# Patient Record
Sex: Male | Born: 1980 | Race: White | Hispanic: No | Marital: Married | Smoking: Current every day smoker
Health system: Southern US, Community
[De-identification: ages and names within clinical notes are randomized; demographics above are authoritative.]

---

## 2005-08-30 ENCOUNTER — Inpatient Hospital Stay (HOSPITAL_COMMUNITY): Admission: EM | Admit: 2005-08-30 | Discharge: 2005-09-02 | Payer: Self-pay | Admitting: Psychiatry

## 2005-08-30 ENCOUNTER — Ambulatory Visit: Payer: Self-pay | Admitting: Psychiatry

## 2015-02-18 ENCOUNTER — Encounter (HOSPITAL_COMMUNITY): Payer: Self-pay | Admitting: Family Medicine

## 2015-02-18 ENCOUNTER — Emergency Department (HOSPITAL_COMMUNITY)
Admission: EM | Admit: 2015-02-18 | Discharge: 2015-02-18 | Disposition: A | Discharge status: Home / Self Care | Payer: 59 | Attending: Emergency Medicine | Admitting: Emergency Medicine

## 2015-02-18 DIAGNOSIS — Z23 Encounter for immunization: Secondary | ICD-10-CM | POA: Diagnosis not present

## 2015-02-18 DIAGNOSIS — Z720 Tobacco use: Secondary | ICD-10-CM | POA: Insufficient documentation

## 2015-02-18 DIAGNOSIS — Y9389 Activity, other specified: Secondary | ICD-10-CM | POA: Insufficient documentation

## 2015-02-18 DIAGNOSIS — Y998 Other external cause status: Secondary | ICD-10-CM | POA: Diagnosis not present

## 2015-02-18 DIAGNOSIS — Y9289 Other specified places as the place of occurrence of the external cause: Secondary | ICD-10-CM | POA: Insufficient documentation

## 2015-02-18 DIAGNOSIS — Y288XXA Contact with other sharp object, undetermined intent, initial encounter: Secondary | ICD-10-CM | POA: Diagnosis not present

## 2015-02-18 DIAGNOSIS — IMO0002 Reserved for concepts with insufficient information to code with codable children: Secondary | ICD-10-CM

## 2015-02-18 DIAGNOSIS — S61512A Laceration without foreign body of left wrist, initial encounter: Secondary | ICD-10-CM | POA: Diagnosis present

## 2015-02-18 MED ORDER — LIDOCAINE HCL 1 % IJ SOLN
5.0000 mL | Freq: Once | INTRAMUSCULAR | Status: AC
  Administered 2015-02-18: 5 mL
  Filled 2015-02-18: qty 5

## 2015-02-18 MED ORDER — IBUPROFEN 400 MG PO TABS
800.0000 mg | ORAL_TABLET | Freq: Once | ORAL | Status: AC
  Administered 2015-02-18: 800 mg via ORAL
  Filled 2015-02-18: qty 2

## 2015-02-18 MED ORDER — TETANUS-DIPHTH-ACELL PERTUSSIS 5-2.5-18.5 LF-MCG/0.5 IM SUSP
0.5000 mL | Freq: Once | INTRAMUSCULAR | Status: AC
  Administered 2015-02-18: 0.5 mL via INTRAMUSCULAR
  Filled 2015-02-18: qty 0.5

## 2015-02-18 NOTE — ED Provider Notes (Signed)
CSN: [id number]     Arrival date & time 02/18/15  1505 History  This chart was scribed for non-physician practitioner, Eyvonne Mechanic, PA-C working with Melene Plan, DO by Gwenyth Ober, ED scribe. This patient was seen in room TR06C/TR06C and the patient's care was started at 3:18 PM   Chief Complaint  Patient presents with  . Laceration   The history is provided by the patient. No language interpreter was used.   HPI Comments:  Derek Estrada is a 34 y.o. male who presents to the Emergency Department complaining of a laceration, with controlled bleeding, to his medial left wrist, with associated 6/10 pain, that occurred PTA. Marland Kitchen He has not tried any treatment PTA, but notes he had his wound wrapped tightly until he arrived in the ED. 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 Pt denies foreign bodies in the wound. He also denies decreased ROM.   History reviewed. No pertinent past medical history. History reviewed. No pertinent past surgical history. History reviewed. No pertinent family history. Social History  Substance Use Topics  . Smoking status: Current Every Day Smoker  . Smokeless tobacco: None  . Alcohol Use: None    Review of Systems  All other systems reviewed and are negative.  Allergies  Review of patient's allergies indicates no known allergies.  Home Medications   Prior to Admission medications   Not on File   BP 126/75 mmHg  Pulse 80  Temp(Src) 98.3 F (36.8 C) (Oral)  Resp 14  Ht  (1.753 m)  Wt 125 lb (56.7 kg)  BMI 18.45 kg/m2  SpO2 100%   Physical Exam  Constitutional: He is oriented to person, place, and time. He appears well-developed and well-nourished. No distress.  HENT:  Head: Normocephalic and atraumatic.  Eyes: Conjunctivae and EOM are normal.  Neck: Neck supple. No tracheal deviation present.  Cardiovascular: Normal rate, regular rhythm, normal heart sounds  and intact distal pulses.   Pulmonary/Chest: Effort normal and breath sounds normal. No respiratory distress.  Clear to ausculation bilaterally  Musculoskeletal: Normal range of motion.  Good ROM of left elbow and arm Decreased sensation in 4th digit Normal ROM of all fingers 1 cm laceration to medial left wrist  Neurological: He is alert and oriented to person, place, and time.  Skin: Skin is warm and dry.  Psychiatric: He has a normal mood and affect. His behavior is normal.  Nursing note and vitals reviewed.   ED Course  Procedures   DIAGNOSTIC STUDIES: Oxygen Saturation is 100% on RA, normal by my interpretation.    COORDINATION OF CARE: 3:27 PM Discussed treatment plan with pt at bedside and pt agreed to plan.  3:28 PM  LACERATION REPAIR Performed by: Harlin Heys, PA student supervised by Burna Forts, PA-C Consent: Verbal consent obtained. Risks and benefits: risks, benefits and alternatives were discussed Patient identity confirmed: provided demographic data Time out performed prior to procedure Prepped and Draped in normal sterile fashion Wound explored Laceration Location: Medial left wrist Laceration Length: 1.0 cm No Foreign Bodies seen or palpated Anesthesia: local infiltration Local anesthetic: lidocaine 1% without epinephrine Anesthetic total: 2 ml Irrigation method: syringe Amount of cleaning: standard Skin closure: simple Number of sutures or staples: 2 Technique:simple interrupted  Patient tolerance: Patient tolerated the procedure well with no immediate complications.  Labs Review Labs Reviewed - No data to display  Imaging Review No results found. I  have personally reviewed and evaluated these images and lab results as part of my medical decision-making.   EKG Interpretation None      MDM   Final diagnoses:  Laceration   Labs:    Imaging:   Consults:   Therapeutics: Suture repair,Tetanus, Ibuprofen   Discharge Meds:    Assessment/Plan: simple laceration with no tendon, nerve, major vessel involvement, or foreign bodies. Wound care instructions given. Return precautions given  I personally performed the services described in this documentation, which was scribed in my presence. The recorded information has been reviewed and is accurate.   Eyvonne Mechanic, PA-C 02/19/15 1822  Melene Plan, DO 02/20/15 0010

## 2015-02-18 NOTE — ED Notes (Signed)
Pt sts he was working and a piece of metal cut left wrist. 1/2 inch lac to left wrist. Bleeding controlled. feeling and movement in tact.

## 2015-02-18 NOTE — Discharge Instructions (Signed)
Please follow-up with primary care provider for medical professional for evaluation in 7 days for suture removal. If signs of infection present please return immediately.

## 2015-02-23 ENCOUNTER — Emergency Department (HOSPITAL_COMMUNITY)
Admission: EM | Admit: 2015-02-23 | Discharge: 2015-02-23 | Disposition: A | Discharge status: Home / Self Care | Payer: 59 | Attending: Emergency Medicine | Admitting: Emergency Medicine

## 2015-02-23 ENCOUNTER — Encounter (HOSPITAL_COMMUNITY): Payer: Self-pay | Admitting: Emergency Medicine

## 2015-02-23 DIAGNOSIS — Y9389 Activity, other specified: Secondary | ICD-10-CM | POA: Insufficient documentation

## 2015-02-23 DIAGNOSIS — Y9289 Other specified places as the place of occurrence of the external cause: Secondary | ICD-10-CM | POA: Diagnosis not present

## 2015-02-23 DIAGNOSIS — Z720 Tobacco use: Secondary | ICD-10-CM | POA: Diagnosis not present

## 2015-02-23 DIAGNOSIS — Y998 Other external cause status: Secondary | ICD-10-CM | POA: Diagnosis not present

## 2015-02-23 DIAGNOSIS — T63441A Toxic effect of venom of bees, accidental (unintentional), initial encounter: Secondary | ICD-10-CM | POA: Insufficient documentation

## 2015-02-23 MED ORDER — DIPHENHYDRAMINE HCL 25 MG PO TABS: 25.0000 mg | ORAL_TABLET | Freq: Four times a day (QID) | ORAL | Status: AC | PRN

## 2015-02-23 MED ORDER — IBUPROFEN 800 MG PO TABS: 800.0000 mg | ORAL_TABLET | Freq: Three times a day (TID) | ORAL | Status: AC | PRN

## 2015-02-23 MED ORDER — EPINEPHRINE 0.3 MG/0.3ML IJ SOAJ: 0.3000 mg | Freq: Once | INTRAMUSCULAR | Status: AC | PRN

## 2015-02-23 NOTE — ED Notes (Signed)
Pt c/o stung by a bee on Saturday. Pt presents with swelling to left eye. Pt denies shortness of breath.

## 2015-02-23 NOTE — Discharge Instructions (Signed)
Read the information below.  Use the prescribed medication as directed.  Please discuss all new medications with your pharmacist.  You may return to the Emergency Department at any time for worsening condition or any new symptoms that concern you.  If you develop increased redness, swelling, pus draining from the wound, or fevers greater than 100.4, return to the ER immediately for a recheck.   If you develop worsening pain in your eye, change in your vision, swelling around your eye, difficulty moving your eye, or fevers greater than 100.4, see your eye doctor or return to the Emergency Department immediately for a recheck.      Bee, Wasp, or Hornet Sting Your caregiver has diagnosed you as having an insect sting. An insect sting appears as a red lump in the skin that sometimes has a tiny hole in the center, or it may have a stinger in the center of the wound. The most common stings are from wasps, hornets and bees. Individuals have different reactions to insect stings.  A normal reaction may cause pain, swelling, and redness around the sting site.  A localized allergic reaction may cause swelling and redness that extends beyond the sting site.  A large local reaction may continue to develop over the next 12 to 36 hours.  On occasion, the reactions can be severe (anaphylactic reaction). An anaphylactic reaction may cause wheezing; difficulty breathing; chest pain; fainting; raised, itchy, red patches on the skin; a sick feeling to your stomach (nausea); vomiting; cramping; or diarrhea. If you have had an anaphylactic reaction to an insect sting in the past, you are more likely to have one again. HOME CARE INSTRUCTIONS   With bee stings, a small sac of poison is left in the wound. Brushing across this with something such as a credit card, or anything similar, will help remove this and decrease the amount of the reaction. This same procedure will not help a wasp sting as they do not leave behind a  stinger and poison sac.  Apply a cold compress for 10 to 20 minutes every hour for 1 to 2 days, depending on severity, to reduce swelling and itching.  To lessen pain, a paste made of water and baking soda may be rubbed on the bite or sting and left on for 5 minutes.  To relieve itching and swelling, you may use take medication or apply medicated creams or lotions as directed.  Only take over-the-counter or prescription medicines for pain, discomfort, or fever as directed by your caregiver.  Wash the sting site daily with soap and water. Apply antibiotic ointment on the sting site as directed.  If you suffered a severe reaction:  If you did not require hospitalization, an adult will need to stay with you for 24 hours in case the symptoms return.  You may need to wear a medical bracelet or necklace stating the allergy.  You and your family need to learn when and how to use an anaphylaxis kit or epinephrine injection.  If you have had a severe reaction before, always carry your anaphylaxis kit with you. SEEK MEDICAL CARE IF:   None of the above helps within 2 to 3 days.  The area becomes red, warm, tender, and swollen beyond the area of the bite or sting.  You have an oral temperature above 102 F (38.9 C). SEEK IMMEDIATE MEDICAL CARE IF:  You have symptoms of an allergic reaction which are:  Wheezing.  Difficulty breathing.  Chest pain.  Lightheadedness  or fainting.  Itchy, raised, red patches on the skin.  Nausea, vomiting, cramping or diarrhea. ANY OF THESE SYMPTOMS MAY REPRESENT A SERIOUS PROBLEM THAT IS AN EMERGENCY. Do not wait to see if the symptoms will go away. Get medical help right away. Call your local emergency services (911 in [place] DO NOT drive yourself to the hospital. MAKE SURE YOU:   Understand these instructions.  Will watch your condition.  Will get help right away if you are not doing well or get worse. Document Released: 05/16/2005 Document  Revised: 08/08/2011 Document Reviewed: 10/31/2009 [facility] Patient Information 2015 [place], [place]. This information is not intended to replace advice given to you by your health care provider. Make sure you discuss any questions you have with your health care provider.

## 2015-02-23 NOTE — ED Provider Notes (Signed)
CSN: [id number]     Arrival date & time 02/23/15  4098 History  This chart was scribed for non-physician practitioner, Trixie Dredge, PA-C, working with Elwin Mocha, MD by Marica Otter, ED Scribe. This patient was seen in room TR10C/TR10C and the patient's care was started at 10:35 AM.   Chief Complaint  Patient presents with  . Insect Bite  . Eye Injury  . Allergic Reaction   The history is provided by the patient. No language interpreter was used.   PCP: No PCP Per Patient HPI Comments: Derek Estrada is a 34 y.o. male, who has an allergy to bee venom, who presents to the Emergency Department complaining of traumatic, gradually worsening left eye swelling with associated 9/10, lower eyelid pain, left cheek pain, and left sided facial swelling onset 2 days ago after he was stung by a yellow jacket on the left cheek. Pt reports taking benadryl, tylenol and applying ice at home with no relief. Pt denies visual disturbance, fever, chills, congestion, eye trauma.      History reviewed. No pertinent past medical history. History reviewed. No pertinent past surgical history. No family history on file. Social History  Substance Use Topics  . Smoking status: Current Every Day Smoker  . Smokeless tobacco: None  . Alcohol Use: None    Review of Systems  Constitutional: Negative for fever and chills.  HENT: Positive for facial swelling. Negative for congestion, sore throat and trouble swallowing.   Eyes: Negative for photophobia, pain, discharge, redness, itching and visual disturbance.       Left eye pain   Respiratory: Negative for cough, choking, shortness of breath, wheezing and stridor.   Gastrointestinal: Negative for nausea, vomiting, abdominal pain and diarrhea.  Musculoskeletal: Negative for neck stiffness.  Skin: Positive for wound.  Allergic/Immunologic: Negative for immunocompromised state.  Hematological: Does not bruise/bleed easily.  Psychiatric/Behavioral: Negative for  self-injury.   Allergies  Review of patient's allergies indicates no known allergies.  Home Medications   Prior to Admission medications   Not on File   Triage Vitals: BP 128/77 mmHg  Pulse 78  Temp(Src) 97.4 F (36.3 C) (Oral)  Resp 20  Ht  (1.753 m)  Wt 123 lb (55.792 kg)  BMI 18.16 kg/m2  SpO2 98% Physical Exam  Constitutional: He appears well-developed and well-nourished. No distress.  HENT:  Head: Normocephalic and atraumatic.    Eyes: Conjunctivae, EOM and lids are normal. Pupils are equal, round, and reactive to light.  Neck: Neck supple.  Pulmonary/Chest: Effort normal.  Neurological: He is alert.  Skin: He is not diaphoretic.  Nursing note and vitals reviewed.  ED Course  Procedures (including critical care time) DIAGNOSTIC STUDIES: Oxygen Saturation is 98% on RA, nl by my interpretation.    COORDINATION OF CARE: 10:38 AM: Discussed treatment plan which includes Rx for epinephrine injection, meds, with pt at bedside; patient verbalizes understanding and agrees with treatment plan.  MDM   Final diagnoses:  Bee sting, accidental or unintentional, initial encounter    Afebrile, nontoxic patient with bee sting to right cheek.  Swelling to lower lid.  No erythema or warmth.  Doubt infection.  Eye itself if not affected.    D/C home with benadryl, ibuprofen, refill epi pen (his is expired). PCP follow up. Discussed result, findings, treatment, and follow up  with patient.  Pt given return precautions.  Pt verbalizes understanding and agrees with plan.      I personally performed the services described in this documentation,  which was scribed in my presence. The recorded information has been reviewed and is accurate.    Trixie Dredge, PA-C 02/23/15 1721  Elwin Mocha, MD 02/24/15 1254

## 2015-02-23 NOTE — ED Notes (Signed)
Declined W/C at D/C and was escorted to lobby by RN. 

## 2015-09-02 ENCOUNTER — Emergency Department (HOSPITAL_COMMUNITY)
Admission: EM | Admit: 2015-09-02 | Discharge: 2015-09-03 | Disposition: A | Discharge status: Home / Self Care | Payer: BLUE CROSS/BLUE SHIELD | Attending: Emergency Medicine | Admitting: Emergency Medicine

## 2015-09-02 ENCOUNTER — Encounter (HOSPITAL_COMMUNITY): Payer: Self-pay | Admitting: Family Medicine

## 2015-09-02 DIAGNOSIS — R197 Diarrhea, unspecified: Secondary | ICD-10-CM | POA: Insufficient documentation

## 2015-09-02 DIAGNOSIS — F17200 Nicotine dependence, unspecified, uncomplicated: Secondary | ICD-10-CM | POA: Insufficient documentation

## 2015-09-02 DIAGNOSIS — R112 Nausea with vomiting, unspecified: Secondary | ICD-10-CM | POA: Diagnosis not present

## 2015-09-02 LAB — URINALYSIS, ROUTINE W REFLEX MICROSCOPIC
GLUCOSE, UA: NEGATIVE mg/dL
Hgb urine dipstick: NEGATIVE
KETONES UR: NEGATIVE mg/dL
LEUKOCYTES UA: NEGATIVE
Nitrite: NEGATIVE
PROTEIN: 30 mg/dL — AB
Specific Gravity, Urine: 1.031 — ABNORMAL HIGH (ref 1.005–1.030)
pH: 6 (ref 5.0–8.0)

## 2015-09-02 LAB — CBC
HCT: 48 % (ref 39.0–52.0)
Hemoglobin: 16.6 g/dL (ref 13.0–17.0)
MCH: 32.2 pg (ref 26.0–34.0)
MCHC: 34.6 g/dL (ref 30.0–36.0)
MCV: 93.2 fL (ref 78.0–100.0)
PLATELETS: 240 10*3/uL (ref 150–400)
RBC: 5.15 MIL/uL (ref 4.22–5.81)
RDW: 13.2 % (ref 11.5–15.5)
WBC: 12.1 10*3/uL — AB (ref 4.0–10.5)

## 2015-09-02 LAB — COMPREHENSIVE METABOLIC PANEL
ALBUMIN: 3.8 g/dL (ref 3.5–5.0)
ALT: 17 U/L (ref 17–63)
AST: 21 U/L (ref 15–41)
Alkaline Phosphatase: 69 U/L (ref 38–126)
Anion gap: 11 (ref 5–15)
BILIRUBIN TOTAL: 0.5 mg/dL (ref 0.3–1.2)
BUN: 13 mg/dL (ref 6–20)
CHLORIDE: 104 mmol/L (ref 101–111)
CO2: 22 mmol/L (ref 22–32)
CREATININE: 0.84 mg/dL (ref 0.61–1.24)
Calcium: 8.8 mg/dL — ABNORMAL LOW (ref 8.9–10.3)
GFR calc Af Amer: >60 mL/min (ref >60)
Glucose, Bld: 113 mg/dL — ABNORMAL HIGH (ref 65–99)
Potassium: 3.8 mmol/L (ref 3.5–5.1)
SODIUM: 137 mmol/L (ref 135–145)
TOTAL PROTEIN: 6.8 g/dL (ref 6.5–8.1)

## 2015-09-02 LAB — URINE MICROSCOPIC-ADD ON: RBC / HPF: NONE SEEN RBC/hpf (ref 0–5)

## 2015-09-02 LAB — LIPASE, BLOOD: LIPASE: 77 U/L — AB (ref 11–51)

## 2015-09-02 MED ORDER — ONDANSETRON HCL 4 MG/2ML IJ SOLN
4.0000 mg | Freq: Once | INTRAMUSCULAR | Status: AC
  Administered 2015-09-02: 4 mg via INTRAVENOUS
  Filled 2015-09-02: qty 2

## 2015-09-02 MED ORDER — MORPHINE SULFATE (PF) 4 MG/ML IV SOLN
4.0000 mg | Freq: Once | INTRAVENOUS | Status: AC
  Administered 2015-09-02: 4 mg via INTRAVENOUS
  Filled 2015-09-02: qty 1

## 2015-09-02 MED ORDER — SODIUM CHLORIDE 0.9 % IV BOLUS (SEPSIS)
1000.0000 mL | Freq: Once | INTRAVENOUS | Status: AC
  Administered 2015-09-02: 1000 mL via INTRAVENOUS

## 2015-09-02 NOTE — ED Provider Notes (Signed)
CSN: [id number]     Arrival date & time 09/02/15  1527 History   First MD Initiated Contact with Patient 09/02/15 2235     Chief Complaint  Patient presents with  . Emesis  . Diarrhea     (Consider location/radiation/quality/duration/timing/severity/associated sxs/prior Treatment) HPI   This is a 35 year old male who presents to the emergency department with chief compliant of nausea, vomiting and diarrhea. Onset of symptoms was around 5 PM yesterday. His wife and daughter both have the same symptoms and her being evaluated here in the ED. He complains of diffuse abdominal pain, "kidney pain," nausea and vomiting. His last episode of vomiting was about 30 minutes ago. He has had watery brown stools without hematemesis or hematochezia. Patient denies ingestion of suspicious foods, recent foreign travel. Medications for his symptoms. He denies chills, fevers, myalgias. He denies other urinary symptoms  History reviewed. No pertinent past medical history. History reviewed. No pertinent past surgical history. No family history on file. Social History  Substance Use Topics  . Smoking status: Current Every Day Smoker  . Smokeless tobacco: None  . Alcohol Use: None    Review of Systems  Ten systems reviewed and are negative for acute change, except as noted in the HPI.    Allergies  Bee pollen  Home Medications   Prior to Admission medications   Medication Sig Start Date End Date Taking? Authorizing Provider  diphenhydrAMINE (BENADRYL) 25 MG tablet Take 1 tablet (25 mg total) by mouth every 6 (six) hours as needed for itching or allergies. Patient not taking: Reported on 09/02/2015 02/23/15   Trixie Dredge, PA-C  EPINEPHrine 0.3 mg/0.3 mL IJ SOAJ injection Inject 0.3 mLs (0.3 mg total) into the muscle once as needed (severe allergic reaction). Patient not taking: Reported on 09/02/2015 02/23/15   Trixie Dredge, PA-C  ibuprofen (ADVIL,MOTRIN) 800 MG tablet Take 1 tablet (800 mg total) by mouth  every 8 (eight) hours as needed for mild pain or moderate pain. Patient not taking: Reported on 09/02/2015 02/23/15   Trixie Dredge, PA-C   BP 120/82 mmHg  Pulse 70  Temp(Src) 98.4 F (36.9 C) (Oral)  Resp 16  Ht  (1.753 m)  Wt 63.504 kg  BMI 20.67 kg/m2  SpO2 100% Physical Exam  Constitutional: He appears well-developed and well-nourished. No distress.  HENT:  Head: Normocephalic and atraumatic.  Eyes: Conjunctivae are normal. No scleral icterus.  Neck: Normal range of motion. Neck supple.  Cardiovascular: Normal rate, regular rhythm and normal heart sounds.   Pulmonary/Chest: Effort normal and breath sounds normal. No respiratory distress.  Abdominal: Soft. There is tenderness (diffuse).  Musculoskeletal: He exhibits no edema.  Neurological: He is alert.  Skin: Skin is warm and dry. He is not diaphoretic.  Psychiatric: His behavior is normal.  Nursing note and vitals reviewed.   ED Course  Procedures (including critical care time) Labs Review Labs Reviewed  LIPASE, BLOOD - Abnormal; Notable for the following:    Lipase 77 (*)    All other components within normal limits  COMPREHENSIVE METABOLIC PANEL - Abnormal; Notable for the following:    Glucose, Bld 113 (*)    Calcium 8.8 (*)    All other components within normal limits  CBC - Abnormal; Notable for the following:    WBC 12.1 (*)    All other components within normal limits  URINALYSIS, ROUTINE W REFLEX MICROSCOPIC (NOT AT [facility]) - Abnormal; Notable for the following:    Color, Urine AMBER (*)  Specific Gravity, Urine 1.031 (*)    Bilirubin Urine SMALL (*)    Protein, ur 30 (*)    All other components within normal limits  URINE MICROSCOPIC-ADD ON - Abnormal; Notable for the following:    Squamous Epithelial / LPF 0-5 (*)    Bacteria, UA FEW (*)    All other components within normal limits    Imaging Review No results found. I have personally reviewed and evaluated these images and lab results as part of  my medical decision-making.   EKG Interpretation None      MDM   Final diagnoses:  Nausea vomiting and diarrhea   Labs reviewed. Mild elevation in the patient's lipase.  Patient with symptoms consistent with viral gastroenteritis.  Vitals are stable, no fever.  No signs of dehydration, tolerating PO fluids > 6 oz.  Lungs are clear.  No focal abdominal pain, no concern for appendicitis, cholecystitis, pancreatitis, ruptured viscus, UTI, kidney stone, or any other abdominal etiology.  Supportive therapy indicated with return if symptoms worsen.  Patient counseled.     Arthor Captainbigail Charman Blasco, PA-C 09/04/15 1527  Pricilla LovelessScott Goldston, MD 09/15/15 [phone]

## 2015-09-02 NOTE — ED Notes (Signed)
Patient ate at [employer] yesterday afternoon and developed vomiting and diarrhea at 5pm yesterday. Has vomited multiple times and reports that abdomen is still cramping

## 2015-09-03 MED ORDER — DICYCLOMINE HCL 20 MG PO TABS: 20.0000 mg | ORAL_TABLET | Freq: Two times a day (BID) | ORAL | Status: AC

## 2015-09-03 MED ORDER — PROCHLORPERAZINE EDISYLATE 5 MG/ML IJ SOLN
5.0000 mg | Freq: Once | INTRAMUSCULAR | Status: AC
  Administered 2015-09-03: 5 mg via INTRAVENOUS
  Filled 2015-09-03: qty 2

## 2015-09-03 MED ORDER — PROMETHAZINE HCL 25 MG RE SUPP: 25.0000 mg | Freq: Four times a day (QID) | RECTAL | Status: AC | PRN

## 2015-09-03 MED ORDER — DIPHENHYDRAMINE HCL 50 MG/ML IJ SOLN
12.5000 mg | Freq: Once | INTRAMUSCULAR | Status: AC
  Administered 2015-09-03: 12.5 mg via INTRAVENOUS
  Filled 2015-09-03: qty 1

## 2015-09-03 NOTE — Discharge Instructions (Signed)
Take tylenol or motrin for your pain. You may alternate the medication every 3 hours. Continue to drink fluids. You may take over the counter imodium for diarrhea relief.   Food Choices to Help Relieve Diarrhea, Adult When you have diarrhea, the foods you eat and your eating habits are very important. Choosing the right foods and drinks can help relieve diarrhea. Also, because diarrhea can last up to 7 days, you need to replace lost fluids and electrolytes (such as sodium, potassium, and chloride) in order to help prevent dehydration.  WHAT GENERAL GUIDELINES DO I NEED TO FOLLOW?  Slowly drink 1 cup (8 oz) of fluid for each episode of diarrhea. If you are getting enough fluid, your urine will be clear or pale yellow.  Eat starchy foods. Some good choices include white rice, white toast, pasta, low-fiber cereal, baked potatoes (without the skin), saltine crackers, and bagels.  Avoid large servings of any cooked vegetables.  Limit fruit to two servings per day. A serving is  cup or 1 small piece.  Choose foods with less than 2 g of fiber per serving.  Limit fats to less than 8 tsp (38 g) per day.  Avoid fried foods.  Eat foods that have probiotics in them. Probiotics can be found in certain dairy products.  Avoid foods and beverages that may increase the speed at which food moves through the stomach and intestines (gastrointestinal tract). Things to avoid include:  High-fiber foods, such as dried fruit, raw fruits and vegetables, nuts, seeds, and whole grain foods.  Spicy foods and high-fat foods.  Foods and beverages sweetened with high-fructose corn syrup, honey, or sugar alcohols such as xylitol, sorbitol, and mannitol. WHAT FOODS ARE RECOMMENDED? Grains White rice. White, [place]French, or pita breads (fresh or toasted), including plain rolls, buns, or bagels. White pasta. Saltine, soda, or graham crackers. Pretzels. Low-fiber cereal. Cooked cereals made with water (such as cornmeal,  farina, or cream cereals). Plain muffins. Matzo. Melba toast. Zwieback.  Vegetables Potatoes (without the skin). Strained tomato and vegetable juices. Most well-cooked and canned vegetables without seeds. Tender lettuce. Fruits Cooked or canned applesauce, apricots, cherries, fruit cocktail, grapefruit, peaches, pears, or plums. Fresh bananas, apples without skin, cherries, grapes, cantaloupe, grapefruit, peaches, oranges, or plums.  Meat and Other Protein Products Baked or boiled chicken. Eggs. Tofu. Fish. Seafood. Smooth peanut butter. Ground or well-cooked tender beef, ham, veal, lamb, pork, or poultry.  Dairy Plain yogurt, kefir, and unsweetened liquid yogurt. Lactose-free milk, buttermilk, or soy milk. Plain hard cheese. Beverages Sport drinks. Clear broths. Diluted fruit juices (except prune). Regular, caffeine-free sodas such as ginger ale. Water. Decaffeinated teas. Oral rehydration solutions. Sugar-free beverages not sweetened with sugar alcohols. Other Bouillon, broth, or soups made from recommended foods.  The items listed above may not be a complete list of recommended foods or beverages. Contact your dietitian for more options. WHAT FOODS ARE NOT RECOMMENDED? Grains Whole grain, whole wheat, bran, or rye breads, rolls, pastas, crackers, and cereals. Wild or brown rice. Cereals that contain more than 2 g of fiber per serving. Corn tortillas or taco shells. Cooked or dry oatmeal. Granola. Popcorn. Vegetables Raw vegetables. Cabbage, broccoli, Brussels sprouts, artichokes, baked beans, beet greens, corn, kale, legumes, peas, sweet potatoes, and yams. Potato skins. Cooked spinach and cabbage. Fruits Dried fruit, including raisins and dates. Raw fruits. Stewed or dried prunes. Fresh apples with skin, apricots, mangoes, pears, raspberries, and strawberries.  Meat and Other Protein Products Chunky peanut butter. Nuts and seeds. Beans  and lentils. Derek Estrada.  Dairy High-fat cheeses. Milk,  chocolate milk, and beverages made with milk, such as milk shakes. Cream. Ice cream. Sweets and Desserts Sweet rolls, doughnuts, and sweet breads. Pancakes and waffles. Fats and Oils Butter. Cream sauces. Margarine. Salad oils. Plain salad dressings. Olives. Avocados.  Beverages Caffeinated beverages (such as coffee, tea, soda, or energy drinks). Alcoholic beverages. Fruit juices with pulp. Prune juice. Soft drinks sweetened with high-fructose corn syrup or sugar alcohols. Other Coconut. Hot sauce. Chili powder. Mayonnaise. Gravy. Cream-based or milk-based soups.  The items listed above may not be a complete list of foods and beverages to avoid. Contact your dietitian for more information. WHAT SHOULD I DO IF I BECOME DEHYDRATED? Diarrhea can sometimes lead to dehydration. Signs of dehydration include dark urine and dry mouth and skin. If you think you are dehydrated, you should rehydrate with an oral rehydration solution. These solutions can be purchased at pharmacies, retail stores, or online.  Drink -1 cup (120-240 mL) of oral rehydration solution each time you have an episode of diarrhea. If drinking this amount makes your diarrhea worse, try drinking smaller amounts more often. For example, drink 1-3 tsp (5-15 mL) every 5-10 minutes.  A general rule for staying hydrated is to drink 1-2 L of fluid per day. Talk to your health care provider about the specific amount you should be drinking each day. Drink enough fluids to keep your urine clear or pale yellow.   This information is not intended to replace advice given to you by your health care provider. Make sure you discuss any questions you have with your health care provider.   Document Released: 08/06/2003 Document Revised: 06/06/2014 Document Reviewed: 04/08/2013 Elsevier Interactive Patient Education 2016 Else[employer] Inc[employer]  Nausea and Vomiting Nausea is a sick feeling that often comes before throwing up (vomiting). Vomiting is a reflex  where stomach contents come out of your mouth. Vomiting can cause severe loss of body fluids (dehydration). Children and elderly adults can become dehydrated quickly, especially if they also have diarrhea. Nausea and vomiting are symptoms of a condition or disease. It is important to find the cause of your symptoms. CAUSES   Direct irritation of the stomach lining. This irritation can result from increased acid production (gastroesophageal reflux disease), infection, food poisoning, taking certain medicines (such as nonsteroidal anti-inflammatory drugs), alcohol use, or tobacco use.  Signals from the brain.These signals could be caused by a headache, heat exposure, an inner ear disturbance, increased pressure in the brain from injury, infection, a tumor, or a concussion, pain, emotional stimulus, or metabolic problems.  An obstruction in the gastrointestinal tract (bowel obstruction).  Illnesses such as diabetes, hepatitis, gallbladder problems, appendicitis, kidney problems, cancer, sepsis, atypical symptoms of a heart attack, or eating disorders.  Medical treatments such as chemotherapy and radiation.  Receiving medicine that makes you sleep (general anesthetic) during surgery. DIAGNOSIS Your caregiver may ask for tests to be done if the problems do not improve after a few days. Tests may also be done if symptoms are severe or if the reason for the nausea and vomiting is not clear. Tests may include:  Urine tests.  Blood tests.  Stool tests.  Cultures (to look for evidence of infection).  X-rays or other imaging studies. Test results can help your caregiver make decisions about treatment or the need for additional tests. TREATMENT You need to stay well hydrated. Drink frequently but in small amounts.You may wish to drink water, sports drinks, clear broth, or eat  frozen ice pops or gelatin dessert to help stay hydrated.When you eat, eating slowly may help prevent nausea.There are  also some antinausea medicines that may help prevent nausea. HOME CARE INSTRUCTIONS   Take all medicine as directed by your caregiver.  If you do not have an appetite, do not force yourself to eat. However, you must continue to drink fluids.  If you have an appetite, eat a normal diet unless your caregiver tells you differently.  Eat a variety of complex carbohydrates (rice, wheat, potatoes, bread), lean meats, yogurt, fruits, and vegetables.  Avoid high-fat foods because they are more difficult to digest.  Drink enough water and fluids to keep your urine clear or pale yellow.  If you are dehydrated, ask your caregiver for specific rehydration instructions. Signs of dehydration may include:  Severe thirst.  Dry lips and mouth.  Dizziness.  Dark urine.  Decreasing urine frequency and amount.  Confusion.  Rapid breathing or pulse. SEEK IMMEDIATE MEDICAL CARE IF:   You have blood or brown flecks (like coffee grounds) in your vomit.  You have black or bloody stools.  You have a severe headache or stiff neck.  You are confused.  You have severe abdominal pain.  You have chest pain or trouble breathing.  You do not urinate at least once every 8 hours.  You develop cold or clammy skin.  You continue to vomit for longer than 24 to 48 hours.  You have a fever. MAKE SURE YOU:   Understand these instructions.  Will watch your condition.  Will get help right away if you are not doing well or get worse.   This information is not intended to replace advice given to you by your health care provider. Make sure you discuss any questions you have with your health care provider.   Document Released: 05/16/2005 Document Revised: 08/08/2011 Document Reviewed: 10/13/2010 Elsevier Interactive Patient Education [employer]

## 2016-03-04 ENCOUNTER — Emergency Department (HOSPITAL_COMMUNITY)
Admission: EM | Admit: 2016-03-04 | Discharge: 2016-03-04 | Disposition: A | Discharge status: Home / Self Care | Payer: BLUE CROSS/BLUE SHIELD | Attending: Emergency Medicine | Admitting: Emergency Medicine

## 2016-03-04 ENCOUNTER — Emergency Department (HOSPITAL_COMMUNITY): Payer: BLUE CROSS/BLUE SHIELD

## 2016-03-04 ENCOUNTER — Encounter (HOSPITAL_COMMUNITY): Payer: Self-pay | Admitting: Vascular Surgery

## 2016-03-04 DIAGNOSIS — Y929 Unspecified place or not applicable: Secondary | ICD-10-CM | POA: Diagnosis not present

## 2016-03-04 DIAGNOSIS — Y999 Unspecified external cause status: Secondary | ICD-10-CM | POA: Insufficient documentation

## 2016-03-04 DIAGNOSIS — F17200 Nicotine dependence, unspecified, uncomplicated: Secondary | ICD-10-CM | POA: Diagnosis not present

## 2016-03-04 DIAGNOSIS — S60221A Contusion of right hand, initial encounter: Secondary | ICD-10-CM

## 2016-03-04 DIAGNOSIS — Y939 Activity, unspecified: Secondary | ICD-10-CM | POA: Insufficient documentation

## 2016-03-04 DIAGNOSIS — W458XXA Other foreign body or object entering through skin, initial encounter: Secondary | ICD-10-CM | POA: Insufficient documentation

## 2016-03-04 DIAGNOSIS — S61411A Laceration without foreign body of right hand, initial encounter: Secondary | ICD-10-CM | POA: Insufficient documentation

## 2016-03-04 MED ORDER — LIDOCAINE HCL (PF) 1 % IJ SOLN
5.0000 mL | Freq: Once | INTRAMUSCULAR | Status: AC
  Administered 2016-03-04: 5 mL
  Filled 2016-03-04: qty 5

## 2016-03-04 MED ORDER — HYDROCODONE-ACETAMINOPHEN 5-325 MG PO TABS: 1.0000 | ORAL_TABLET | Freq: Four times a day (QID) | ORAL | 0 refills | Status: AC | PRN

## 2016-03-04 MED ORDER — OXYCODONE-ACETAMINOPHEN 5-325 MG PO TABS
1.0000 | ORAL_TABLET | Freq: Once | ORAL | Status: AC
  Administered 2016-03-04: 1 via ORAL
  Filled 2016-03-04: qty 1

## 2016-03-04 NOTE — ED Triage Notes (Signed)
Pt reports to the ED for eval of right hand laceration. He reports his hand slipped and he cut his knuckles on sheet metal. Bleeding controlled. Pt reports his last tetanus shot was less than a year ago. Denies any other injury.

## 2016-03-04 NOTE — ED Notes (Signed)
Pt verbalized understanding of d/c instructions and has no further questions. Pt stable and NAD.  

## 2016-03-04 NOTE — ED Provider Notes (Signed)
[facility]-EMERGENCY DEPT Provider Note   CSN: [id number] Arrival date & time: 03/04/16  1635  By signing my name below, I, Derek Estrada, attest that this documentation has been prepared under the direction and in the presence of [name], NP. Electronically Signed: [name], ED Scribe. 03/04/16. 6:15 PM.  History   Chief Complaint Chief Complaint  Patient presents with  . Hand Pain  . Extremity Laceration   The history is provided by the patient. No language interpreter was used.  Laceration   The incident occurred 1 to 2 hours ago. The laceration is located on the right arm. Size: 1.2cm. The laceration mechanism was a a metal edge. The pain is moderate. The pain has been constant since onset. It is unknown if a foreign body is present. His tetanus status is UTD.   HPI Comments: Derek Estrada is a 35 y.o. male who presents to the Emergency Department complaining of laceration sustained over the right third knuckle that occurred ~2 hours ago. Bleeding is controlled. He notes moderate, gradually worsening pain to the area since the incident. Pt reports that he was working with sheet metal, when a piece of the metal came across the dorsal aspect of his hand, sustaining his laceration. No other associated injuries. Pt denies weakness, numbness, or any other associated symptoms. Tetanus is UTD.   History reviewed. No pertinent past medical history.  There are no active problems to display for this patient.  History reviewed. No pertinent surgical history.  Home Medications    Prior to Admission medications   Medication Sig Start Date End Date Taking? Authorizing Provider  dicyclomine (BENTYL) 20 MG tablet Take 1 tablet (20 mg total) by mouth 2 (two) times daily. 09/03/15   Arthor Captain, PA-C  diphenhydrAMINE (BENADRYL) 25 MG tablet Take 1 tablet (25 mg total) by mouth every 6 (six) hours as needed for itching or allergies. Patient not taking: Reported on 09/02/2015  02/23/15   Trixie Dredge, PA-C  EPINEPHrine 0.3 mg/0.3 mL IJ SOAJ injection Inject 0.3 mLs (0.3 mg total) into the muscle once as needed (severe allergic reaction). Patient not taking: Reported on 09/02/2015 02/23/15   Trixie Dredge, PA-C  ibuprofen (ADVIL,MOTRIN) 800 MG tablet Take 1 tablet (800 mg total) by mouth every 8 (eight) hours as needed for mild pain or moderate pain. Patient not taking: Reported on 09/02/2015 02/23/15   Trixie Dredge, PA-C  promethazine (PHENERGAN) 25 MG suppository Place 1 suppository (25 mg total) rectally every 6 (six) hours as needed for nausea or vomiting. 09/03/15   Arthor Captain, PA-C   Family History No family history on file.  Social History Social History  Substance Use Topics  . Smoking status: Current Every Day Smoker  . Smokeless tobacco: Never Used  . Alcohol use Not on file   Allergies   Bee pollen  Review of Systems Review of Systems  Musculoskeletal: Positive for myalgias.  Skin: Positive for wound.  Neurological: Negative for weakness and numbness.  All other systems reviewed and are negative.  Physical Exam Updated Vital Signs BP 133/82 (BP Location: Left Arm)   Pulse 73   Temp 98.5 F (36.9 C) (Oral)   Resp 18   SpO2 100%   Physical Exam  Constitutional: He appears well-developed and well-nourished.  HENT:  Head: Normocephalic.  Eyes: Conjunctivae are normal.  Cardiovascular: Normal rate.   Pulmonary/Chest: Effort normal. No respiratory distress.  Abdominal: He exhibits no distension.  Musculoskeletal: Normal range of motion.  1.2cm laceration overlaying  the MP joint of the third finger on the right hand. No tendon involvement.   Neurological: He is alert.  Skin: Skin is warm and dry. Capillary refill takes less than 2 seconds. Laceration noted.  Psychiatric: He has a normal mood and affect. His behavior is normal.  Nursing note and vitals reviewed.  ED Treatments / Results  DIAGNOSTIC STUDIES: Oxygen Saturation is 100% on RA, normal  by my interpretation.   COORDINATION OF CARE: 6:06 PM-Discussed next steps with pt. Pt verbalized understanding and is agreeable with the plan.   Radiology Dg Hand 2 View Right  Result Date: 03/04/2016 CLINICAL DATA:  Contusion and laceration of the right hand today. EXAM: RIGHT HAND - 2 VIEW COMPARISON:  None. FINDINGS: The joint spaces are maintained. No acute fracture or radiopaque foreign body. Soft tissue swelling and probable laceration over the dorsum of the hand at the level of the metacarpal heads. Probable remote fifth metacarpal neck fracture. IMPRESSION: No acute fracture or radiopaque foreign body. Electronically Signed   By: Derek MeyerP.  Estrada M.D.   On: 03/04/2016 18:28   Procedures Procedures  LACERATION REPAIR PROCEDURE NOTE The patient's identification was confirmed and consent was obtained. This procedure was performed by [name], NP at 7:50 PM. Site: overlaying the MP joint of the third finger on the right hand Sterile procedures observed Anesthetic used (type and amt): Lidocaine 1% w/o epi (1cc) Suture type/size: 4-0 Proline Length: 1.2cm # of Sutures: 3 Technique: simple interuppted Complexity: simple Antibx ointment applied Tetanus UTD  Site anesthetized, irrigated with NS, explored without evidence of foreign body, wound well approximated, site covered with dry, sterile dressing. Patient tolerated procedure well without complications. Instructions for care discussed verbally and patient provided with additional written instructions for homecare and f/u.  Medications Ordered in ED Medications  oxyCODONE-acetaminophen (PERCOCET/ROXICET) 5-325 MG per tablet 1 tablet (1 tablet Oral Given 03/04/16 1845)  lidocaine (PF) (XYLOCAINE) 1 % injection 5 mL (5 mLs Infiltration Given by Other 03/04/16 1925)   Initial Impression / Assessment and Plan / ED Course  I have reviewed the triage vital signs and the nursing notes.  Pertinent labs & imaging results that were  available during my care of the patient were reviewed by me and considered in my medical decision making (see chart for details).  Clinical Course   Patient is a 35yo male that presents with laceration overlaying the MP joint of the right third digit of the right hand. Tdap booster UTD. Pain managed in the ED with Percocet. XR of right hand is unremarkable for obvious bony injury. Pressure irrigation performed. Bottom of the wound visualized with bleeding controled. Laceration occurred < 8 hours prior to repair which was well tolerated. Pt has no co morbidities to effect normal wound healing. Discussed suture home care w pt and answered questions. Pt to f-u for wound check and suture removal in ~7 days. Pt is hemodynamically stable w no complaints prior to dc.    Final Clinical Impressions(s) / ED Diagnoses   Final diagnoses:  Laceration of right hand without foreign body, initial encounter  Contusion of right hand, initial encounter   New Prescriptions Discharge Medication List as of 03/04/2016  7:52 PM    START taking these medications   Details  HYDROcodone-acetaminophen (NORCO/VICODIN) 5-325 MG tablet Take 1 tablet by mouth every 6 (six) hours as needed., Starting Fri 03/04/2016, Print       I personally performed the services described in this documentation, which was scribed in  my presence. The recorded information has been reviewed and is accurate.     [name], NP 03/05/16 0230    Gwyneth Sprout, MD 03/05/16 2136

## 2016-03-04 NOTE — Discharge Instructions (Signed)
Suture removal in 7-10 days 

## 2018-11-12 ENCOUNTER — Emergency Department (HOSPITAL_COMMUNITY)
Admission: EM | Admit: 2018-11-12 | Discharge: 2018-11-12 | Disposition: A | Discharge status: Home / Self Care | Payer: BLUE CROSS/BLUE SHIELD | Attending: Emergency Medicine | Admitting: Emergency Medicine

## 2018-11-12 ENCOUNTER — Encounter (HOSPITAL_COMMUNITY): Payer: Self-pay | Admitting: Emergency Medicine

## 2018-11-12 ENCOUNTER — Emergency Department (HOSPITAL_COMMUNITY): Payer: BLUE CROSS/BLUE SHIELD

## 2018-11-12 DIAGNOSIS — J069 Acute upper respiratory infection, unspecified: Secondary | ICD-10-CM | POA: Insufficient documentation

## 2018-11-12 DIAGNOSIS — R0981 Nasal congestion: Secondary | ICD-10-CM | POA: Diagnosis not present

## 2018-11-12 DIAGNOSIS — F17210 Nicotine dependence, cigarettes, uncomplicated: Secondary | ICD-10-CM | POA: Diagnosis not present

## 2018-11-12 DIAGNOSIS — R05 Cough: Secondary | ICD-10-CM | POA: Insufficient documentation

## 2018-11-12 DIAGNOSIS — R431 Parosmia: Secondary | ICD-10-CM | POA: Diagnosis not present

## 2018-11-12 DIAGNOSIS — Z20828 Contact with and (suspected) exposure to other viral communicable diseases: Secondary | ICD-10-CM | POA: Insufficient documentation

## 2018-11-12 DIAGNOSIS — R0602 Shortness of breath: Secondary | ICD-10-CM | POA: Diagnosis present

## 2018-11-12 NOTE — Discharge Instructions (Addendum)
Please read attached information. If you experience any new or worsening signs or symptoms please return to the emergency room for evaluation. Please follow-up with your primary care provider or specialist as discussed.  °

## 2018-11-12 NOTE — ED Notes (Signed)
Patient verbalizes understanding of discharge instructions. Opportunity for questioning and answers were provided. Armband removed by staff, pt discharged from ED.  

## 2018-11-12 NOTE — ED Provider Notes (Signed)
[facility] Note   CSN: [id number] Arrival date & time: 11/12/18  1145    History   Chief Complaint Chief Complaint  Patient presents with   Shortness of Breath    HPI Derek Estrada is a 38 y.o. male.     HPI   38 year old male presents today with complaints of cough and shortness of breath.  Patient notes approximate 4 days ago he developed cough, rhinorrhea nasal congestion and aches in his legs.  He notes his smell has changed.  He denies fever.  He notes the shortness of breath is at all times.  He denies any chest pain.  No history DVT or PE or any significant risk factors no lower extremity swelling or edema.  He does smoke.  He has no chronic health conditions.  He notes that his partner is sick with pneumonia presently.  He notes he went and had COVID tested this morning but does not have the results.  No past medical history on file.  There are no active problems to display for this patient.   No past surgical history on file.      Home Medications    Prior to Admission medications   Medication Sig Start Date End Date Taking? Authorizing Provider  dicyclomine (BENTYL) 20 MG tablet Take 1 tablet (20 mg total) by mouth 2 (two) times daily. 09/03/15   Arthor CaptainHarris, Abigail, PA-C  diphenhydrAMINE (BENADRYL) 25 MG tablet Take 1 tablet (25 mg total) by mouth every 6 (six) hours as needed for itching or allergies. Patient not taking: Reported on 09/02/2015 02/23/15   Trixie DredgeWest, Emily, PA-C  EPINEPHrine 0.3 mg/0.3 mL IJ SOAJ injection Inject 0.3 mLs (0.3 mg total) into the muscle once as needed (severe allergic reaction). Patient not taking: Reported on 09/02/2015 02/23/15   Trixie DredgeWest, Emily, PA-C  HYDROcodone-acetaminophen (NORCO/VICODIN) 5-325 MG tablet Take 1 tablet by mouth every 6 (six) hours as needed. 03/04/16   Felicie MornSmith, David, NP  ibuprofen (ADVIL,MOTRIN) 800 MG tablet Take 1 tablet (800 mg total) by mouth every 8 (eight) hours as needed  for mild pain or moderate pain. Patient not taking: Reported on 09/02/2015 02/23/15   Trixie DredgeWest, Emily, PA-C  promethazine (PHENERGAN) 25 MG suppository Place 1 suppository (25 mg total) rectally every 6 (six) hours as needed for nausea or vomiting. 09/03/15   Arthor CaptainHarris, Abigail, PA-C    Family History No family history on file.  Social History Social History   Tobacco Use   Smoking status: Current Every Day Smoker    Packs/day: 1.00   Smokeless tobacco: Never Used  Substance Use Topics   Alcohol use: Not on file   Drug use: Not on file     Allergies   Bee pollen   Review of Systems Review of Systems  All other systems reviewed and are negative.    Physical Exam Updated Vital Signs BP 102/69    Pulse (!) 55    Temp 98.2 F (36.8 C)    Resp 20    SpO2 98%   Physical Exam Vitals signs and nursing note reviewed.  Constitutional:      Appearance: He is well-developed.  HENT:     Head: Normocephalic and atraumatic.  Eyes:     General: No scleral icterus.       Right eye: No discharge.        Left eye: No discharge.     Conjunctiva/sclera: Conjunctivae normal.     Pupils: Pupils are equal, round,  and reactive to light.  Neck:     Musculoskeletal: Normal range of motion.     Vascular: No JVD.     Trachea: No tracheal deviation.  Cardiovascular:     Rate and Rhythm: Normal rate and regular rhythm.  Pulmonary:     Effort: Pulmonary effort is normal. No respiratory distress.     Breath sounds: Normal breath sounds. No stridor. No wheezing, rhonchi or rales.  Musculoskeletal:     Comments: No lower extremity edema  Neurological:     Mental Status: He is alert and oriented to person, place, and time.     Coordination: Coordination normal.  Psychiatric:        Behavior: Behavior normal.        Thought Content: Thought content normal.        Judgment: Judgment normal.      ED Treatments / Results  Labs (all labs ordered are listed, but only abnormal results are  displayed) Labs Reviewed - No data to display  EKG None  Radiology Dg Chest 2 View  Result Date: 11/12/2018 CLINICAL DATA:  Cough and shortness of breath.  Smoker. EXAM: CHEST - 2 VIEW COMPARISON:  12/08/2013 FINDINGS: Hyperinflation. Numerous leads and wires project over the chest. Midline trachea. Normal heart size and mediastinal contours. No pleural effusion or pneumothorax. Minimal exclusion of the left costophrenic angle on the frontal radiograph. Clear lungs. IMPRESSION: Hyperinflation, without acute disease. Electronically Signed   By: Abigail Miyamoto M.D.   On: 11/12/2018 14:17    Procedures Procedures (including critical care time)  Medications Ordered in ED Medications - No data to display   Initial Impression / Assessment and Plan / ED Course  I have reviewed the triage vital signs and the nursing notes.  Pertinent labs & imaging results that were available during my care of the patient were reviewed by me and considered in my medical decision making (see chart for details).       38 year old male presents today with complaints of cough and shortness of breath.  He is concerned for infection given his partner has an infection.  He is afebrile well-appearing no acute distress.  His vital signs are very reassuring with no fever no tachycardia and 100% oxygen saturation.  I have low suspicion for any acute life-threatening etiology including PE, ACS.  Patient reports infectious type symptoms, question viral URI.  Patient will have chest x-ray and discharged with symptomatic care if no significant findings noted.  He verbalized understanding and agreement to today's plan had no further questions or concerns.  Final Clinical Impressions(s) / ED Diagnoses   Final diagnoses:  Viral URI with cough    ED Discharge Orders    None       Francee Gentile 11/12/18 1524    Carmin Muskrat, MD 11/13/18 [phone]

## 2018-11-12 NOTE — ED Notes (Signed)
Patient transported to X-ray 

## 2018-11-12 NOTE — ED Triage Notes (Addendum)
Pt reports he has SOB sudden onset and feels like breathing worsening over 3 days. Temp 98.2. 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 He is pack a day smoker. Reports he has cut down to about half a pack a day. Sats 100%.

## 2021-01-26 IMAGING — DX CHEST - 2 VIEW
2 series · 2 of 2 positions shown · non-contrast
Comparison: 12/08/2013

CLINICAL DATA: Cough and shortness of breath.  Smoker.

EXAM:
CHEST - 2 VIEW

[w chest lat]
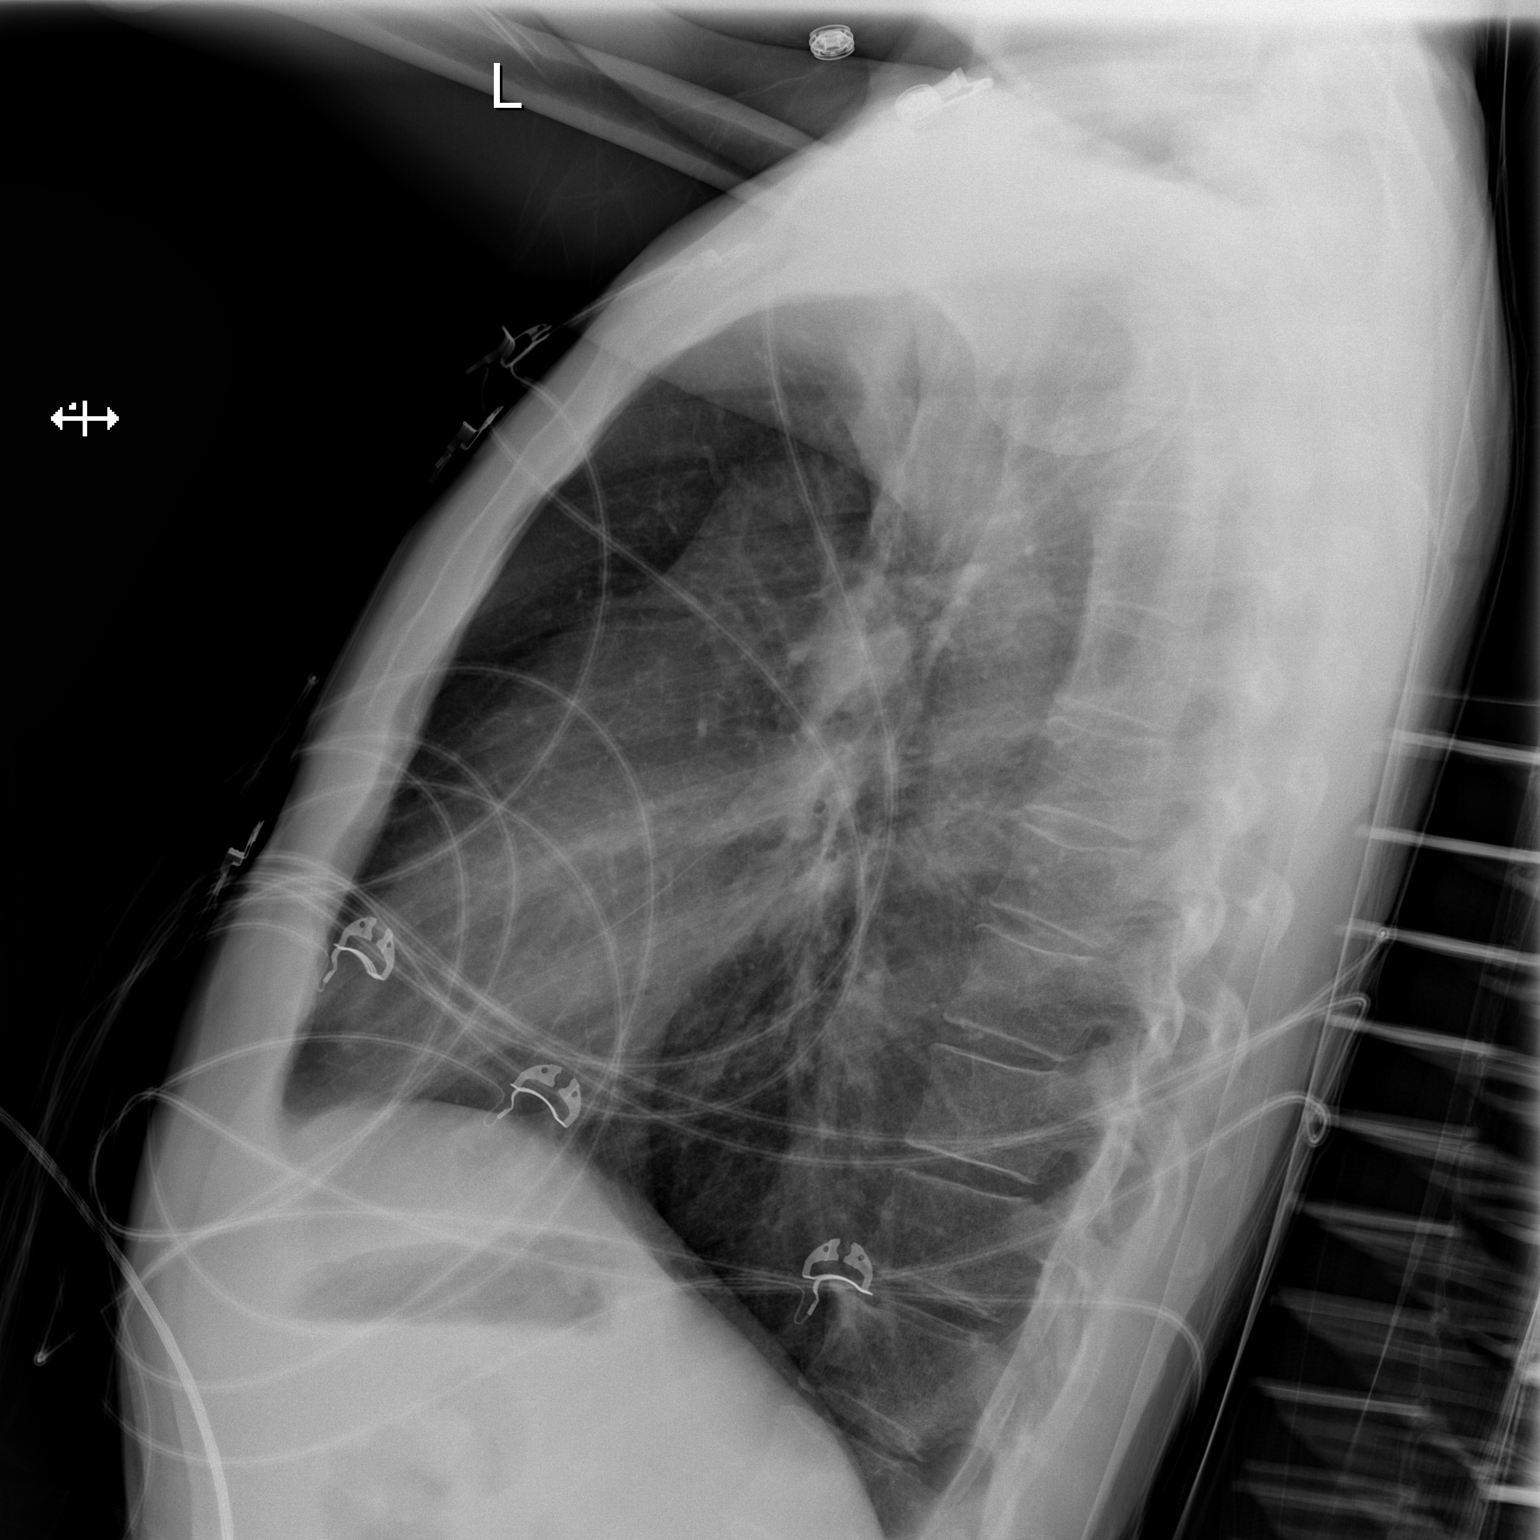

[x chest ap]
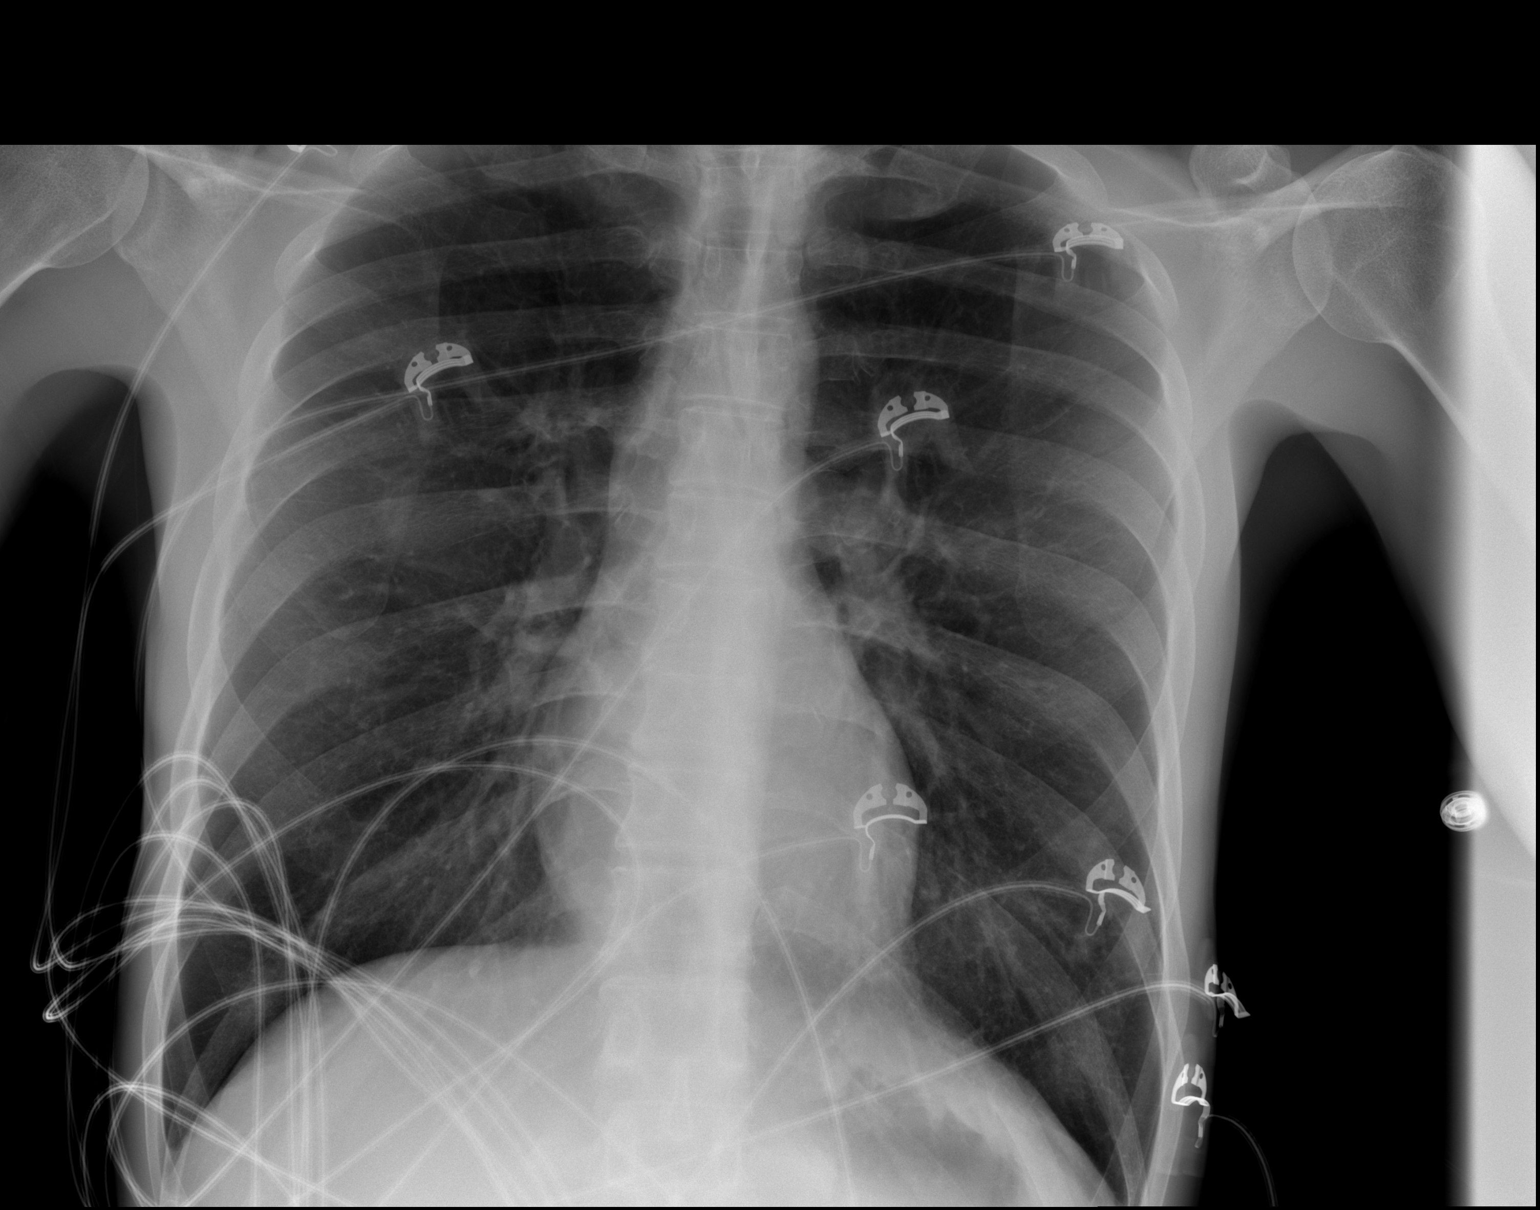

[2 of 2 positions shown; findings below may reference images not displayed]

FINDINGS: Hyperinflation. Numerous leads and wires project over the chest.
Midline trachea. Normal heart size and mediastinal contours. No
pleural effusion or pneumothorax. Minimal exclusion of the left
costophrenic angle on the frontal radiograph. Clear lungs.
IMPRESSION: Hyperinflation, without acute disease.

## 2023-03-15 ENCOUNTER — Other Ambulatory Visit: Payer: Self-pay
